# Patient Record
Sex: Male | Born: 1960 | Race: White | Hispanic: No | Marital: Married | Smoking: Former smoker
Health system: Southern US, Community
[De-identification: ages and names within clinical notes are randomized; demographics above are authoritative.]

## PROBLEM LIST (undated history)

## (undated) DIAGNOSIS — E119 Type 2 diabetes mellitus without complications: Secondary | ICD-10-CM

---

## 2009-10-27 ENCOUNTER — Ambulatory Visit (HOSPITAL_COMMUNITY): Payer: Self-pay | Admitting: Psychology

## 2009-11-03 ENCOUNTER — Ambulatory Visit (HOSPITAL_COMMUNITY): Payer: Self-pay | Admitting: Psychology

## 2014-02-18 ENCOUNTER — Emergency Department (INDEPENDENT_AMBULATORY_CARE_PROVIDER_SITE_OTHER)
Admission: EM | Admit: 2014-02-18 | Discharge: 2014-02-18 | Disposition: A | Discharge status: Home / Self Care | Payer: BC Managed Care – PPO | Source: Home / Self Care | Attending: Family Medicine | Admitting: Family Medicine

## 2014-02-18 ENCOUNTER — Encounter: Payer: Self-pay | Admitting: Emergency Medicine

## 2014-02-18 DIAGNOSIS — S61209A Unspecified open wound of unspecified finger without damage to nail, initial encounter: Secondary | ICD-10-CM

## 2014-02-18 DIAGNOSIS — S61011A Laceration without foreign body of right thumb without damage to nail, initial encounter: Secondary | ICD-10-CM

## 2014-02-18 HISTORY — DX: Type 2 diabetes mellitus without complications: E11.9

## 2014-02-18 MED ORDER — CEPHALEXIN 500 MG PO CAPS: 500.0000 mg | ORAL_CAPSULE | Freq: Three times a day (TID) | ORAL | Status: AC

## 2014-02-18 NOTE — ED Notes (Signed)
Right thumb laceration today cut with a knife

## 2014-02-18 NOTE — Discharge Instructions (Signed)
Keep present bandage in place until followup visit in two days.  Keep wound clean and dry.  Return for suture removal in 10 days.  May take Ibuprofen 200mg , 4 tabs every 8 hours with food.    Laceration Care, Adult A laceration is a cut or lesion that goes through all layers of the skin and into the tissue just beneath the skin. TREATMENT  Some lacerations may not require closure. Some lacerations may not be able to be closed due to an increased risk of infection. It is important to see your caregiver as soon as possible after an injury to minimize the risk of infection and maximize the opportunity for successful closure. If closure is appropriate, pain medicines may be given, if needed. The wound will be cleaned to help prevent infection. Your caregiver will use stitches (sutures), staples, wound glue (adhesive), or skin adhesive strips to repair the laceration. These tools bring the skin edges together to allow for faster healing and a better cosmetic outcome. However, all wounds will heal with a scar. Once the wound has healed, scarring can be minimized by covering the wound with sunscreen during the day for 1 full year. HOME CARE INSTRUCTIONS  For sutures or staples:  Keep the wound clean and dry.  If you were given a bandage (dressing), you should change it at least once a day. Also, change the dressing if it becomes wet or dirty, or as directed by your caregiver.  Wash the wound with soap and water 2 times a day. Rinse the wound off with water to remove all soap. Pat the wound dry with a clean towel.  After cleaning, apply a thin layer of the antibiotic ointment as recommended by your caregiver. This will help prevent infection and keep the dressing from sticking.  You may shower as usual after the first 24 hours. Do not soak the wound in water until the sutures are removed.  Only take over-the-counter or prescription medicines for pain, discomfort, or fever as directed by your  caregiver.  Get your sutures or staples removed as directed by your caregiver. For skin adhesive strips:  Keep the wound clean and dry.  Do not get the skin adhesive strips wet. You may bathe carefully, using caution to keep the wound dry.  If the wound gets wet, pat it dry with a clean towel.  Skin adhesive strips will fall off on their own. You may trim the strips as the wound heals. Do not remove skin adhesive strips that are still stuck to the wound. They will fall off in time. For wound adhesive:  You may briefly wet your wound in the shower or bath. Do not soak or scrub the wound. Do not swim. Avoid periods of heavy perspiration until the skin adhesive has fallen off on its own. After showering or bathing, gently pat the wound dry with a clean towel.  Do not apply liquid medicine, cream medicine, or ointment medicine to your wound while the skin adhesive is in place. This may loosen the film before your wound is healed.  If a dressing is placed over the wound, be careful not to apply tape directly over the skin adhesive. This may cause the adhesive to be pulled off before the wound is healed.  Avoid prolonged exposure to sunlight or tanning lamps while the skin adhesive is in place. Exposure to ultraviolet light in the first year will darken the scar.  The skin adhesive will usually remain in place for 5 to  10 days, then naturally fall off the skin. Do not pick at the adhesive film. You may need a tetanus shot if:  You cannot remember when you had your last tetanus shot.  You have never had a tetanus shot. If you get a tetanus shot, your arm may swell, get red, and feel warm to the touch. This is common and not a problem. If you need a tetanus shot and you choose not to have one, there is a rare chance of getting tetanus. Sickness from tetanus can be serious. SEEK MEDICAL CARE IF:   You have redness, swelling, or increasing pain in the wound.  You see a red line that goes away  from the wound.  You have yellowish-white fluid (pus) coming from the wound.  You have a fever.  You notice a bad smell coming from the wound or dressing.  Your wound breaks open before or after sutures have been removed.  You notice something coming out of the wound such as wood or glass.  Your wound is on your hand or foot and you cannot move a finger or toe. SEEK IMMEDIATE MEDICAL CARE IF:   Your pain is not controlled with prescribed medicine.  You have severe swelling around the wound causing pain and numbness or a change in color in your arm, hand, leg, or foot.  Your wound splits open and starts bleeding.  You have worsening numbness, weakness, or loss of function of any joint around or beyond the wound.  You develop painful lumps near the wound or on the skin anywhere on your body. MAKE SURE YOU:   Understand these instructions.  Will watch your condition.  Will get help right away if you are not doing well or get worse. Document Released: 09/20/2005 Document Revised: 12/13/2011 Document Reviewed: 03/16/2011 [facility] Patient Information 2014 [place], [place].

## 2014-02-18 NOTE — ED Provider Notes (Signed)
CSN: [id number]     Arrival date & time 02/18/14  1422 History   First MD Initiated Contact with Patient 02/18/14 1429     Chief Complaint  Patient presents with  . Extremity Laceration     HPI Comments: Patient suffered laceration to his right thumb by table saw today.  Tdap is current.  Patient is a 53 y.o. male presenting with skin laceration. The history is provided by the patient.  Laceration Location:  Finger Finger laceration location:  R thumb Length (cm):  1.5 Depth:  Through dermis Quality: jagged   Bleeding: controlled   Time since incident:  1 hour Injury mechanism: table saw. Pain details:    Quality:  Aching   Severity:  Mild   Timing:  Constant   Progression:  Unchanged Foreign body present:  No foreign bodies Relieved by:  None tried Tetanus status:  Up to date   Past Medical History  Diagnosis Date  . Diabetes mellitus without complication    History reviewed. No pertinent past surgical history. No family history on file. History  Substance Use Topics  . Smoking status: Former Games developermoker  . Smokeless tobacco: Current User    Types: Chew  . Alcohol Use: No    Review of Systems  All other systems reviewed and are negative.   Allergies  Amoxicillin and Sulfa antibiotics  Home Medications   Prior to Admission medications   Not on File   BP 124/83  Pulse 81  Temp(Src) 98.2 F (36.8 C) (Oral)  Ht 5\' 9"  (1.753 m)  Wt 185 lb (83.915 kg)  BMI 27.31 kg/m2  SpO2 98% Physical Exam  Nursing note and vitals reviewed. Constitutional: He is oriented to person, place, and time. He appears well-developed and well-nourished. No distress.  Eyes: Conjunctivae are normal. Pupils are equal, round, and reactive to light.  Musculoskeletal:       Right hand: He exhibits tenderness and laceration. He exhibits normal range of motion, no bony tenderness, normal two-point discrimination, normal capillary refill, no deformity and no swelling. Normal sensation noted.  Normal strength noted.       Hands: Right thumb has a superficial 1.5cm long laceration/abrasion on the dorsal surface of distal phalanx  as noted on diagram.  DIP joint has full range of motion.    Neurological: He is alert and oriented to person, place, and time.  Skin: Skin is warm and dry.    ED Course  Procedures  Laceration Repair Discussed benefits and risks of procedure and verbal consent obtained. Using sterile technique and digital 2% lidocaine without epinephrine, cleansed wound with Betadine followed by copious lavage with normal saline.  Wound carefully inspected for debris and foreign bodies; none found.  Wound closed with #3, 4-0 interrupted nylon sutures.  Applied Mepelex Lite dressing.  Wound precautions explained to patient.  Return for suture removal in 10 days.           MDM   1. Laceration of right thumb     Keep present bandage in place until followup visit in two days.  Keep wound clean and dry.  Return for suture removal in 10 days.  Begin Keflex (patient notes that he has had no adverse effects from Keflex).  May take Ibuprofen 200mg , 4 tabs every 8 hours with food.  Wound infection precautions discussed.    Lattie HawStephen A Jayla Mackie, MD 02/20/14 [phone]

## 2014-02-20 ENCOUNTER — Emergency Department (INDEPENDENT_AMBULATORY_CARE_PROVIDER_SITE_OTHER)
Admission: EM | Admit: 2014-02-20 | Discharge: 2014-02-20 | Disposition: A | Discharge status: Home / Self Care | Payer: BC Managed Care – PPO | Source: Home / Self Care | Attending: Family Medicine | Admitting: Family Medicine

## 2014-02-20 DIAGNOSIS — Z4800 Encounter for change or removal of nonsurgical wound dressing: Secondary | ICD-10-CM

## 2014-02-20 NOTE — ED Notes (Signed)
[name] is here for check of wound/sutures to right thumb. No s/s of infection, no fever. C/o throbbing pain.

## 2014-02-20 NOTE — ED Provider Notes (Signed)
CSN: [id number]     Arrival date & time 02/20/14  1114 History   First MD Initiated Contact with Patient 02/20/14 1152     Chief Complaint  Patient presents with  . Wound Check      HPI Comments: Patient returns for follow-up of wound on right thumb.  No drainage or swelling.  The history is provided by the patient.    Past Medical History  Diagnosis Date  . Diabetes mellitus without complication    No past surgical history on file. No family history on file. History  Substance Use Topics  . Smoking status: Former Games developermoker  . Smokeless tobacco: Current User    Types: Chew  . Alcohol Use: No    Review of Systems  All other systems reviewed and are negative.   Allergies  Amoxicillin and Sulfa antibiotics  Home Medications   Prior to Admission medications   Medication Sig Start Date End Date Taking? Authorizing Provider  aspirin 81 MG tablet Take 81 mg by mouth daily.    Historical Provider, MD  cephALEXin (KEFLEX) 500 MG capsule Take 1 capsule (500 mg total) by mouth 3 (three) times daily. 02/18/14   Lattie HawStephen A Beese, MD  cetirizine (ZYRTEC) 10 MG tablet Take 10 mg by mouth daily.    Historical Provider, MD  insulin glargine (LANTUS) 100 UNIT/ML injection Inject into the skin at bedtime.    Historical Provider, MD  metFORMIN (GLUCOPHAGE) 1000 MG tablet Take 1,000 mg by mouth 2 (two) times daily with a meal.    Historical Provider, MD  pravastatin (PRAVACHOL) 40 MG tablet Take 40 mg by mouth daily.    Historical Provider, MD  sertraline (ZOLOFT) 100 MG tablet Take 100 mg by mouth daily.    Historical Provider, MD   BP 110/74  Pulse 69  Temp(Src) 98.2 F (36.8 C) (Oral)  Resp 16  SpO2 98% Physical Exam Right thumb reveals excellent granulation tissue.  No swelling or purulent drainage.  Minimal tenderness ED Course  Procedures  None      MDM   1. Dressing change or removal, nonsurgical wound     Continue Keflex.  Leave present dressing in place for 3 days, then  change.  Given samples of Mepetel Light dressing.  Keep wound clean and dry.  Return for worsening symptoms:  Pain, swelling, drainage, etc.  Return in one week for suture removal    Lattie HawStephen A Beese, MD 02/27/14 1140

## 2014-02-20 NOTE — Discharge Instructions (Signed)
Continue Keflex.  Leave present dressing in place for 3 days, then change.  Keep wound clean and dry.  Return for worsening symptoms:  Pain, swelling, drainage, etc.

## 2014-02-27 ENCOUNTER — Encounter: Payer: Self-pay | Admitting: Emergency Medicine

## 2014-02-27 ENCOUNTER — Emergency Department (INDEPENDENT_AMBULATORY_CARE_PROVIDER_SITE_OTHER)
Admission: EM | Admit: 2014-02-27 | Discharge: 2014-02-27 | Disposition: A | Discharge status: Home / Self Care | Payer: BC Managed Care – PPO | Source: Home / Self Care | Attending: Family Medicine | Admitting: Family Medicine

## 2014-02-27 DIAGNOSIS — Z4802 Encounter for removal of sutures: Secondary | ICD-10-CM

## 2014-02-27 NOTE — Discharge Instructions (Signed)
Apply dressing daily until healed.  May wear splint as needed.  Finish Keflex.

## 2014-02-27 NOTE — ED Notes (Addendum)
Richard Sherman is here today for suture removal from his RT thumb from 02/18/14. Denies fever.

## 2014-02-27 NOTE — ED Provider Notes (Signed)
CSN: [id number]     Arrival date & time 02/27/14  1321 History   First MD Initiated Contact with Patient 02/27/14 1343     Chief Complaint  Patient presents with  . Suture / Staple Removal     HPI Comments: Patient returns for suture removal with no complaints.  The history is provided by the patient.    Past Medical History  Diagnosis Date  . Diabetes mellitus without complication    History reviewed. No pertinent past surgical history. History reviewed. No pertinent family history. History  Substance Use Topics  . Smoking status: Former Games developer  . Smokeless tobacco: Current User    Types: Chew  . Alcohol Use: No    Review of Systems No fevers, chills, and sweats.  No pain in right thumb.  No swelling Allergies  Amoxicillin and Sulfa antibiotics  Home Medications   Prior to Admission medications   Medication Sig Start Date End Date Taking? Authorizing Provider  aspirin 81 MG tablet Take 81 mg by mouth daily.    Historical Provider, MD  cephALEXin (KEFLEX) 500 MG capsule Take 1 capsule (500 mg total) by mouth 3 (three) times daily. 02/18/14   Lattie Haw, MD  cetirizine (ZYRTEC) 10 MG tablet Take 10 mg by mouth daily.    Historical Provider, MD  insulin glargine (LANTUS) 100 UNIT/ML injection Inject into the skin at bedtime.    Historical Provider, MD  metFORMIN (GLUCOPHAGE) 1000 MG tablet Take 1,000 mg by mouth 2 (two) times daily with a meal.    Historical Provider, MD  pravastatin (PRAVACHOL) 40 MG tablet Take 40 mg by mouth daily.    Historical Provider, MD  sertraline (ZOLOFT) 100 MG tablet Take 100 mg by mouth daily.    Historical Provider, MD   Temp(Src) 98.2 F (36.8 C) (Oral) Physical Exam Nursing notes and Vital Signs reviewed. Appearance:  Patient appears healthy, stated age, and in no acute distress Right thumb:  Excellent re-epithelialization in progress.  Sutures removed.  No evidence cellulitis.  Thumb has good range of motion    ED Course  Procedures   none      MDM   1. Visit for suture removal; healing well without evidence infection   Applied Xeroform gauze, gauze wrap, and stack splint. Apply dressing daily until healed.  May wear splint as needed.  Finish Keflex.    Lattie Haw, MD 02/27/14 1426

## 2016-10-12 ENCOUNTER — Encounter: Payer: Self-pay | Admitting: Internal Medicine

## 2018-03-21 ENCOUNTER — Ambulatory Visit (INDEPENDENT_AMBULATORY_CARE_PROVIDER_SITE_OTHER): Payer: Self-pay | Admitting: Internal Medicine

## 2018-04-04 ENCOUNTER — Ambulatory Visit (INDEPENDENT_AMBULATORY_CARE_PROVIDER_SITE_OTHER): Payer: Self-pay | Admitting: Internal Medicine

## 2018-07-14 ENCOUNTER — Emergency Department
Admission: EM | Admit: 2018-07-14 | Discharge: 2018-07-14 | Disposition: A | Discharge status: Home / Self Care | Payer: BLUE CROSS/BLUE SHIELD | Source: Home / Self Care | Attending: Family Medicine | Admitting: Family Medicine

## 2018-07-14 ENCOUNTER — Encounter: Payer: Self-pay | Admitting: Emergency Medicine

## 2018-07-14 ENCOUNTER — Emergency Department (INDEPENDENT_AMBULATORY_CARE_PROVIDER_SITE_OTHER): Payer: BLUE CROSS/BLUE SHIELD

## 2018-07-14 ENCOUNTER — Other Ambulatory Visit: Payer: Self-pay

## 2018-07-14 DIAGNOSIS — S60222A Contusion of left hand, initial encounter: Secondary | ICD-10-CM | POA: Diagnosis not present

## 2018-07-14 DIAGNOSIS — S60512A Abrasion of left hand, initial encounter: Secondary | ICD-10-CM | POA: Diagnosis not present

## 2018-07-14 DIAGNOSIS — M79642 Pain in left hand: Secondary | ICD-10-CM

## 2018-07-14 DIAGNOSIS — M7989 Other specified soft tissue disorders: Secondary | ICD-10-CM | POA: Diagnosis not present

## 2018-07-14 MED ORDER — MUPIROCIN 2 % EX OINT: 1.0000 "application " | TOPICAL_OINTMENT | Freq: Three times a day (TID) | CUTANEOUS | 0 refills | Status: AC

## 2018-07-14 NOTE — Discharge Instructions (Addendum)
Change dressing daily and apply Bactroban ointment to wound.  Keep wound clean and dry.  Return for any signs of infection (or follow-up with family doctor):  Increasing redness, swelling, pain, heat, drainage, etc. Apply ace wrap until swelling resolves.  Continue ice pack for 20 to 30 minutes, 2 to 3 times daily until pain and swelling decreases.

## 2018-07-14 NOTE — ED Provider Notes (Signed)
[facility]    CSN: [id number] Arrival date & time: 07/14/18  1437     History   Chief Complaint No chief complaint on file.   HPI Richard Sherman is a 57 y.o. male.   While splitting wood yesterday, a large piece of beechwood fell on the dorsum of patient's left hand, resulting in an abrasion and immediate swelling.  He applied Neosporin and bandage, and began ice packs.  He reports decreased swelling today but persistent pain in hand.  He states that his Tdap is current.  The history is provided by the patient.  Hand Injury  Location:  Hand Hand location:  Dorsum of L hand Injury: yes   Time since incident:  1 day Mechanism of injury: crush   Crush injury:    Mechanism:  Falling object Pain details:    Quality:  Aching   Radiates to:  Does not radiate   Severity:  Moderate   Onset quality:  Sudden   Duration:  1 day   Timing:  Constant   Progression:  Improving Handedness:  Left-handed Tetanus status:  Up to date Prior injury to area:  Yes Relieved by:  Ice Worsened by:  Movement Associated symptoms: stiffness and swelling   Associated symptoms: no decreased range of motion, no muscle weakness, no numbness and no tingling     Past Medical History:  Diagnosis Date  . Diabetes mellitus without complication     There are no active problems to display for this patient.   No past surgical history on file.     Home Medications    Prior to Admission medications   Medication Sig Start Date End Date Taking? Authorizing Provider  aspirin 81 MG tablet Take 81 mg by mouth daily.    [provider]  cephALEXin (KEFLEX) 500 MG capsule Take 1 capsule (500 mg total) by mouth 3 (three) times daily. 02/18/14   Lattie Haw, MD  cetirizine (ZYRTEC) 10 MG tablet Take 10 mg by mouth daily.    [provider]  insulin glargine (LANTUS) 100 UNIT/ML injection Inject into the skin at bedtime.    [provider]  metFORMIN (GLUCOPHAGE)  1000 MG tablet Take 1,000 mg by mouth 2 (two) times daily with a meal.    [provider]  mupirocin ointment (BACTROBAN) 2 % Apply 1 application topically 3 (three) times daily. 07/14/18   Lattie Haw, MD  pravastatin (PRAVACHOL) 40 MG tablet Take 40 mg by mouth daily.    [provider]  sertraline (ZOLOFT) 100 MG tablet Take 100 mg by mouth daily.    [provider]    Family History No family history on file.  Social History Social History   Tobacco Use  . Smoking status: Former Games developer  . Smokeless tobacco: Current User    Types: Chew  Substance Use Topics  . Alcohol use: No  . Drug use: Not on file     Allergies   Amoxicillin and Sulfa antibiotics   Review of Systems Review of Systems  Musculoskeletal: Positive for stiffness.  All other systems reviewed and are negative.    Physical Exam Triage Vital Signs ED Triage Vitals  Enc Vitals Group     BP      Pulse      Resp      Temp      Temp src      SpO2      Weight      Height  Head Circumference      Peak Flow      Pain Score      Pain Loc      Pain Edu?      Excl. in GC?    No data found.  Updated Vital Signs There were no vitals taken for this visit.  Visual Acuity Right Eye Distance:   Left Eye Distance:   Bilateral Distance:    Right Eye Near:   Left Eye Near:    Bilateral Near:     Physical Exam  Constitutional: He appears well-developed and well-nourished. No distress.  HENT:  Head: Atraumatic.  Eyes: Pupils are equal, round, and reactive to light.  Cardiovascular: Normal rate.  Pulmonary/Chest: Effort normal.  Musculoskeletal:       Left hand: He exhibits tenderness and swelling. He exhibits normal range of motion, no bony tenderness, normal two-point discrimination, normal capillary refill, no deformity and no laceration. Normal sensation noted. Decreased strength noted.       Hands: Dorsum of left hand mildly swollen and tender to palpation.   Superficial abrasions dorsum as noted on diagram.  Fingers have good range of motion.  Distal neurovascular function is intact.   Neurological: He is alert.  Skin: Skin is warm and dry.  Nursing note and vitals reviewed.    UC Treatments / Results  Labs (all labs ordered are listed, but only abnormal results are displayed) Labs Reviewed - No data to display  EKG None  Radiology Dg Hand Complete Left  Result Date: 07/14/2018 CLINICAL DATA:  Left hand pain after dropping a piece of firewood on it yesterday. EXAM: LEFT HAND - COMPLETE 3+ VIEW COMPARISON:  None. FINDINGS: There is no evidence of fracture or dislocation. There is no evidence of arthropathy or other focal bone abnormality. Dorsal soft tissue swelling. IMPRESSION: Dorsal soft tissue swelling.  No acute osseous abnormality. Electronically Signed   By: Obie Dredge M.D.   On: 07/14/2018 16:04    Procedures Procedures (including critical care time)  Medications Ordered in UC Medications - No data to display  Initial Impression / Assessment and Plan / UC Course  I have reviewed the triage vital signs and the nursing notes.  Pertinent labs & imaging results that were available during my care of the patient were reviewed by me and considered in my medical decision making (see chart for details).    Wound dorsum cleaned and Bacitracin applied followed by non-stick dressing and light ace wrap. Rx for mupirocin ointment. Call if not improving 3 to 4 days.  Final Clinical Impressions(s) / UC Diagnoses   Final diagnoses:  Contusion of left hand, initial encounter  Abrasion of left hand, initial encounter     Discharge Instructions     Change dressing daily and apply Bactroban ointment to wound.  Keep wound clean and dry.  Return for any signs of infection (or follow-up with family doctor):  Increasing redness, swelling, pain, heat, drainage, etc. Apply ace wrap until swelling resolves.  Continue ice pack for 20 to  30 minutes, 2 to 3 times daily until pain and swelling decreases.     ED Prescriptions    Medication Sig Dispense Auth. Provider   mupirocin ointment (BACTROBAN) 2 % Apply 1 application topically 3 (three) times daily. 22 g Lattie Haw, MD         Lattie Haw, MD 07/14/18 [phone]

## 2018-07-14 NOTE — ED Triage Notes (Signed)
Left hand injury dropped a piece of  fire wood on it yesterday, swollen, bruised

## 2019-12-22 IMAGING — DX DG HAND COMPLETE 3+V*L*
3 series · 3 of 3 positions shown · non-contrast
Comparison: None.

CLINICAL DATA: Left hand pain after dropping a piece of firewood on
it yesterday.

EXAM:
LEFT HAND - COMPLETE 3+ VIEW

[hand pa]
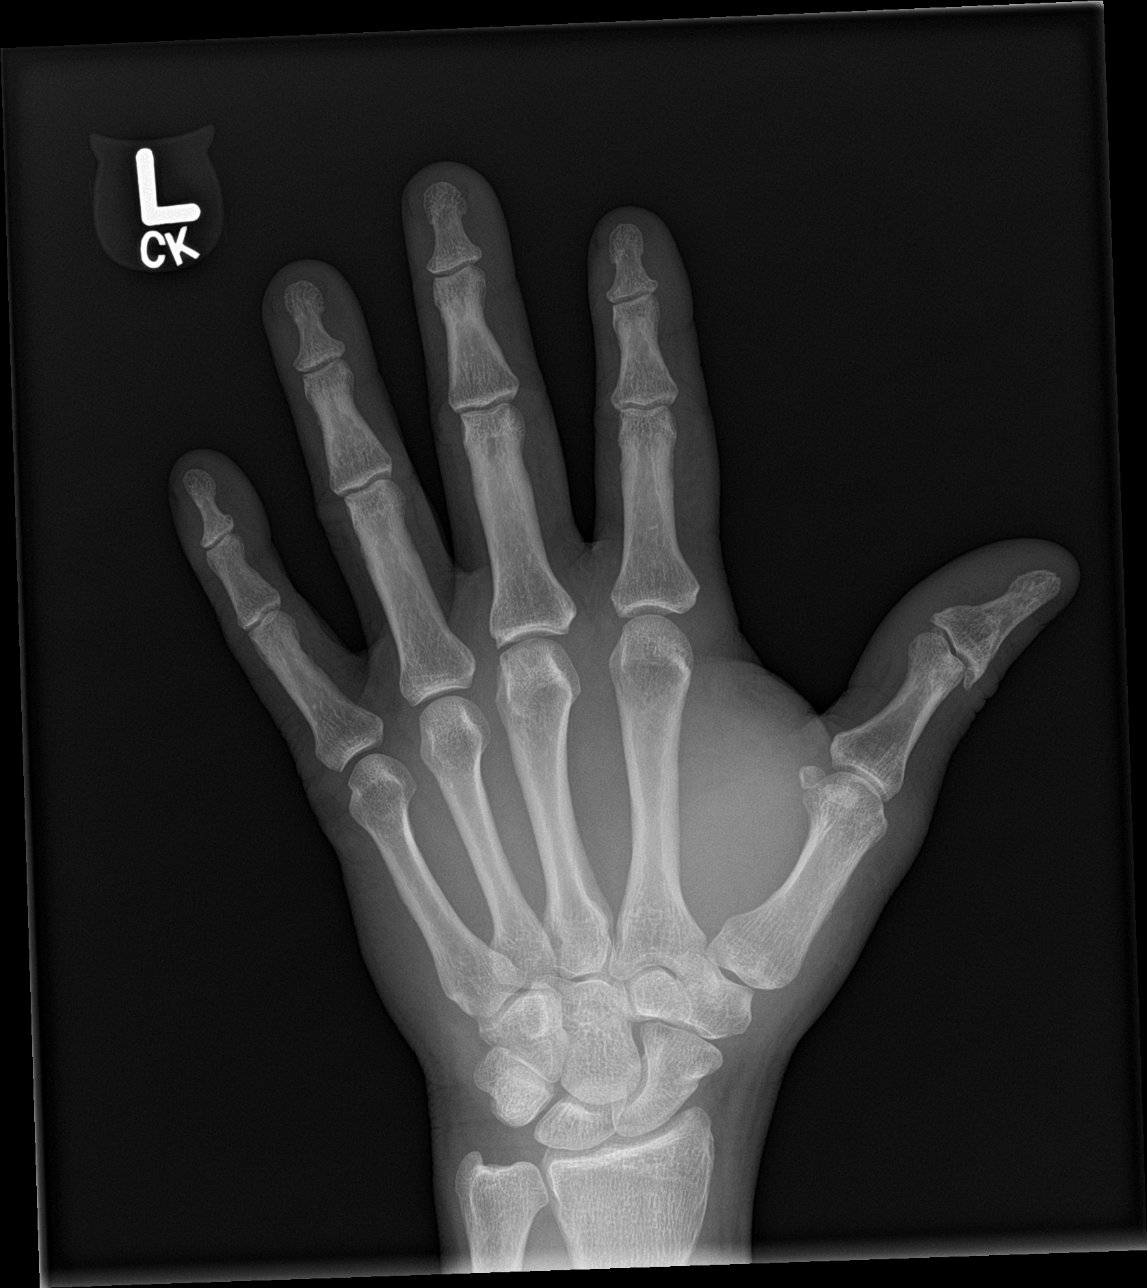

[hand obl]
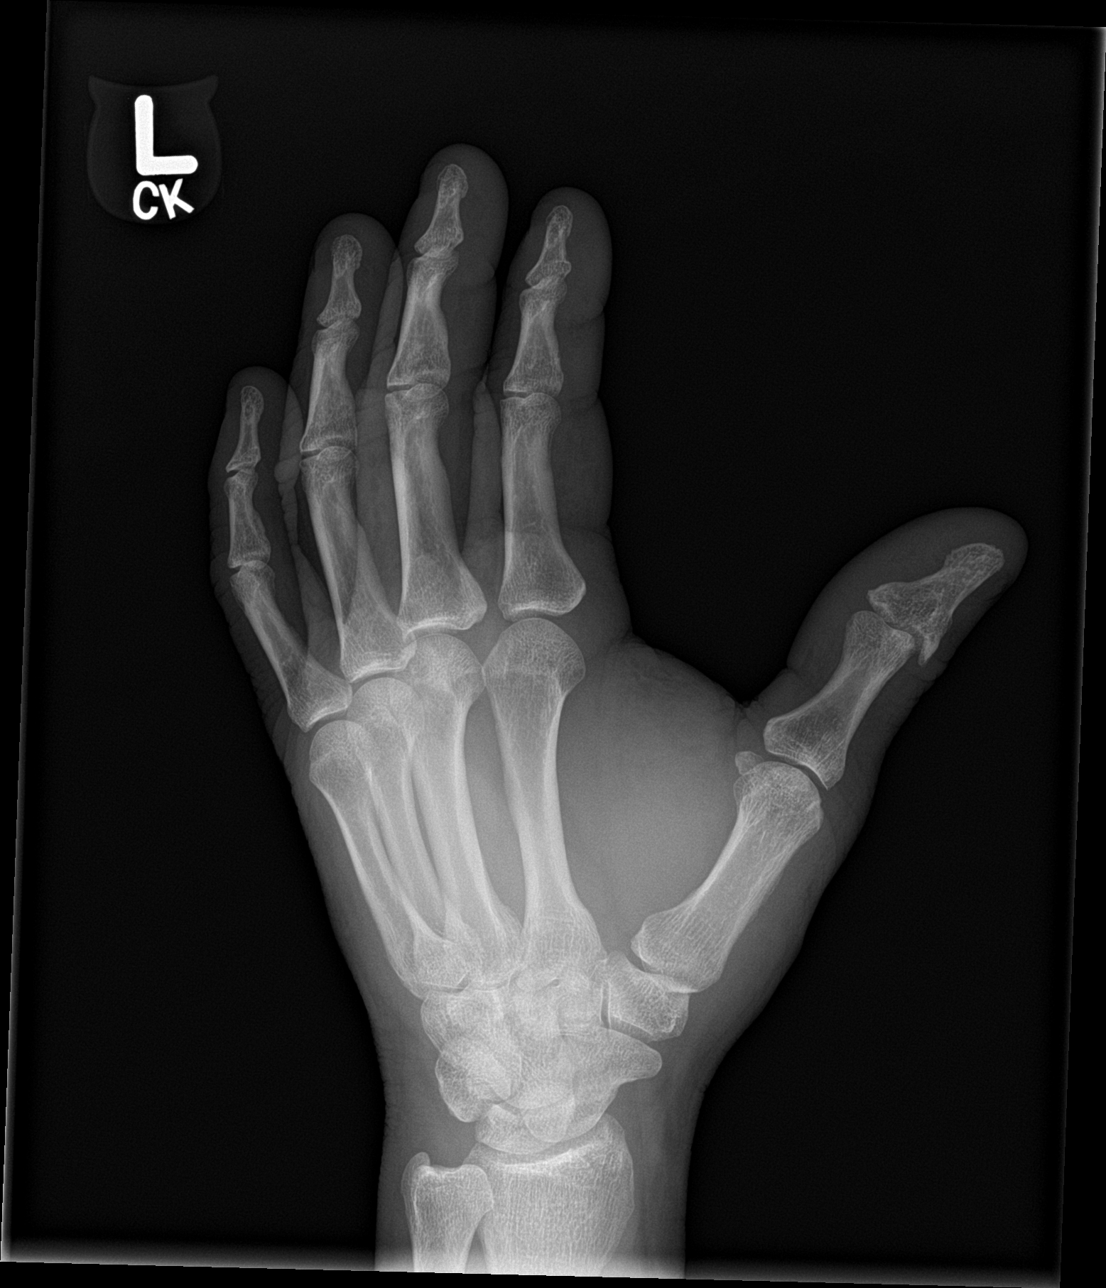

[hand lat]
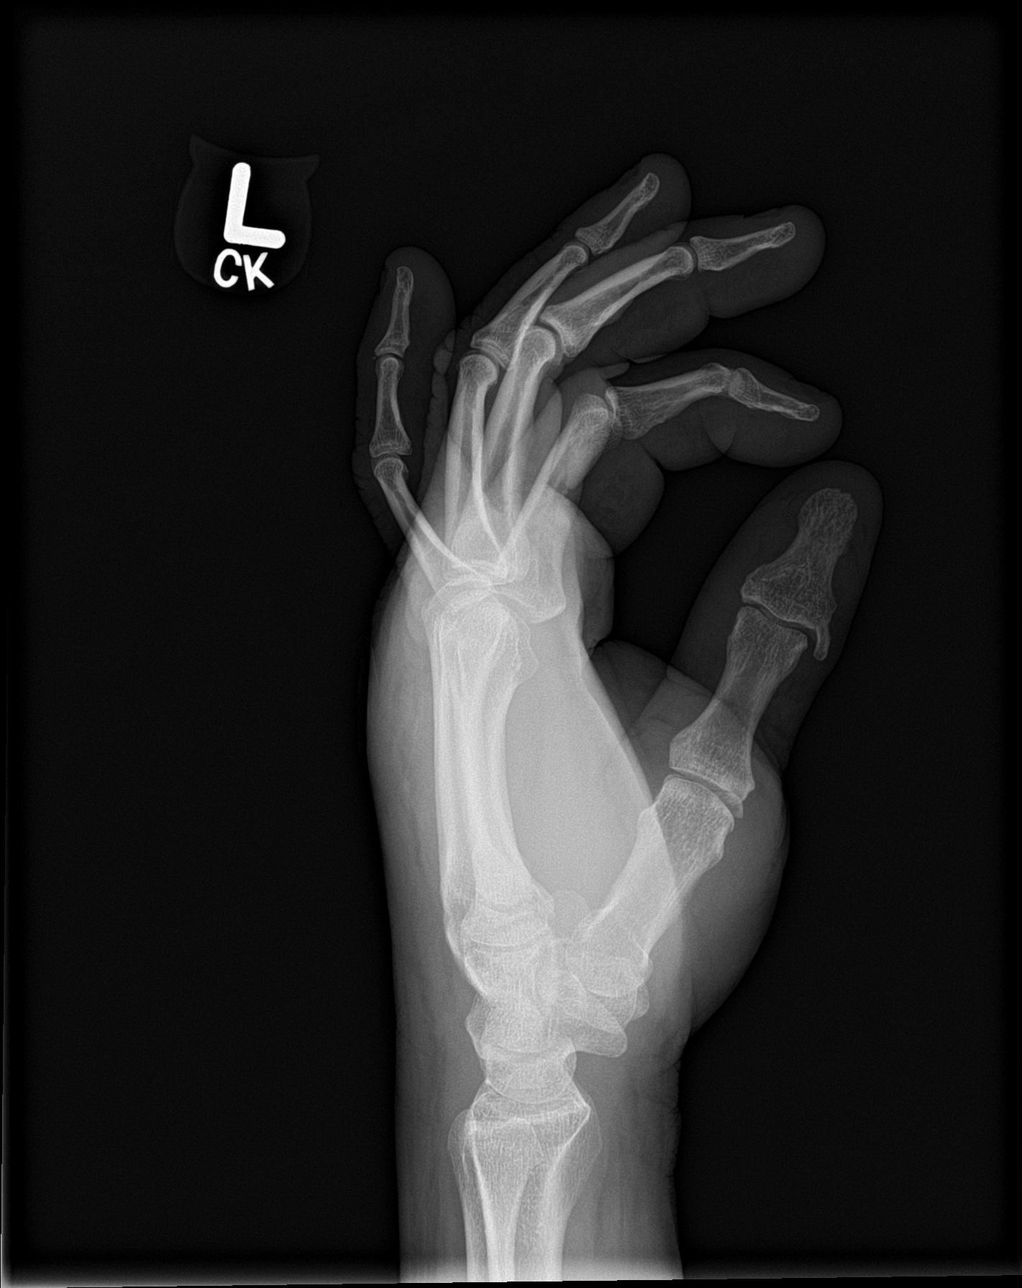

[3 of 3 positions shown; findings below may reference images not displayed]

FINDINGS: There is no evidence of fracture or dislocation. There is no
evidence of arthropathy or other focal bone abnormality. Dorsal soft
tissue swelling.
IMPRESSION: Dorsal soft tissue swelling.  No acute osseous abnormality.
